# Patient Record
Sex: Male | Born: 1975 | Race: White | Hispanic: No | Smoking: Current every day smoker
Health system: Southern US, Community
[De-identification: ages and names within clinical notes are randomized; demographics above are authoritative.]

## PROBLEM LIST (undated history)

## (undated) DIAGNOSIS — B1920 Unspecified viral hepatitis C without hepatic coma: Secondary | ICD-10-CM

## (undated) DIAGNOSIS — C801 Malignant (primary) neoplasm, unspecified: Secondary | ICD-10-CM

## (undated) HISTORY — DX: Malignant (primary) neoplasm, unspecified: C80.1

## (undated) HISTORY — DX: Unspecified viral hepatitis C without hepatic coma: B19.20

## (undated) HISTORY — PX: COLON SURGERY: SHX602

---

## 2013-05-01 DIAGNOSIS — F411 Generalized anxiety disorder: Secondary | ICD-10-CM | POA: Insufficient documentation

## 2013-05-01 DIAGNOSIS — F329 Major depressive disorder, single episode, unspecified: Secondary | ICD-10-CM | POA: Insufficient documentation

## 2013-05-01 DIAGNOSIS — F410 Panic disorder [episodic paroxysmal anxiety] without agoraphobia: Secondary | ICD-10-CM | POA: Insufficient documentation

## 2014-03-19 DIAGNOSIS — I951 Orthostatic hypotension: Secondary | ICD-10-CM | POA: Insufficient documentation

## 2014-03-26 DIAGNOSIS — C189 Malignant neoplasm of colon, unspecified: Secondary | ICD-10-CM | POA: Insufficient documentation

## 2014-03-26 DIAGNOSIS — F17200 Nicotine dependence, unspecified, uncomplicated: Secondary | ICD-10-CM | POA: Insufficient documentation

## 2014-03-26 DIAGNOSIS — R918 Other nonspecific abnormal finding of lung field: Secondary | ICD-10-CM | POA: Insufficient documentation

## 2015-06-06 DIAGNOSIS — R748 Abnormal levels of other serum enzymes: Secondary | ICD-10-CM | POA: Insufficient documentation

## 2016-06-04 ENCOUNTER — Encounter: Payer: Self-pay | Admitting: Family Medicine

## 2016-06-04 ENCOUNTER — Ambulatory Visit (INDEPENDENT_AMBULATORY_CARE_PROVIDER_SITE_OTHER): Payer: BLUE CROSS/BLUE SHIELD | Admitting: Family Medicine

## 2016-06-04 VITALS — BP 128/82 | HR 85 | Ht 68.0 in | Wt 207.7 lb

## 2016-06-04 DIAGNOSIS — Z5189 Encounter for other specified aftercare: Secondary | ICD-10-CM

## 2016-06-04 DIAGNOSIS — B1710 Acute hepatitis C without hepatic coma: Secondary | ICD-10-CM | POA: Diagnosis not present

## 2016-06-04 DIAGNOSIS — C189 Malignant neoplasm of colon, unspecified: Secondary | ICD-10-CM | POA: Insufficient documentation

## 2016-06-04 DIAGNOSIS — F411 Generalized anxiety disorder: Secondary | ICD-10-CM

## 2016-06-04 DIAGNOSIS — F1011 Alcohol abuse, in remission: Secondary | ICD-10-CM

## 2016-06-04 DIAGNOSIS — F1010 Alcohol abuse, uncomplicated: Secondary | ICD-10-CM

## 2016-06-04 DIAGNOSIS — F1990 Other psychoactive substance use, unspecified, uncomplicated: Secondary | ICD-10-CM

## 2016-06-04 DIAGNOSIS — E669 Obesity, unspecified: Secondary | ICD-10-CM

## 2016-06-04 DIAGNOSIS — F1911 Other psychoactive substance abuse, in remission: Secondary | ICD-10-CM

## 2016-06-04 DIAGNOSIS — B1920 Unspecified viral hepatitis C without hepatic coma: Secondary | ICD-10-CM | POA: Insufficient documentation

## 2016-06-04 DIAGNOSIS — F32A Depression, unspecified: Secondary | ICD-10-CM

## 2016-06-04 DIAGNOSIS — K029 Dental caries, unspecified: Secondary | ICD-10-CM

## 2016-06-04 DIAGNOSIS — Z720 Tobacco use: Secondary | ICD-10-CM

## 2016-06-04 DIAGNOSIS — Z9221 Personal history of antineoplastic chemotherapy: Secondary | ICD-10-CM

## 2016-06-04 DIAGNOSIS — R748 Abnormal levels of other serum enzymes: Secondary | ICD-10-CM

## 2016-06-04 DIAGNOSIS — C184 Malignant neoplasm of transverse colon: Secondary | ICD-10-CM

## 2016-06-04 DIAGNOSIS — F17200 Nicotine dependence, unspecified, uncomplicated: Secondary | ICD-10-CM

## 2016-06-04 DIAGNOSIS — F329 Major depressive disorder, single episode, unspecified: Secondary | ICD-10-CM

## 2016-06-04 DIAGNOSIS — K047 Periapical abscess without sinus: Secondary | ICD-10-CM

## 2016-06-04 NOTE — Progress Notes (Signed)
Peter Boyd, D.O. Primary care at [facility]:    Chief Complaint  Patient presents with  . Establish Care  . Hepatitis C    Request referral to specialist   New pt, here to establish care.   HPI: Peter Boyd is a pleasant 40 y.o. male who presents to [place] [facility] today Patient known to me from occ seeing him at previous practice and presents today with his new girlfriend to become est.   He has a long history of drug abuse and alcohol abuse in his 20-30's.  In late 2014 he started having a lot of abdominal symptoms- (episodic N/V/D, blood in stool) and for several weeks to months and a couple OV with me during that time; pt repeatedly refused referral to gastroenterology despite being + Hep C and my repeated recommendations once + bld in stool and pt started wt loss.   It was the second GI Doc that I referred him to that he went and had CT scan and subsequent colonscope revealed mass.    At that time he was addicted to drugs which was the most important thing to him and he did not care to take care of himself.   Lost to f/up with me.   On 03/19/2014 patient presented to [facility] with several day history of abd pain, N/V, wt loss and dizziness.  Found to have obstruction and underwent colonic resection for AdenoCA and discharged 03-26-14.   "Discharge Summaries - in this encounter Peter Montane, MD - 03/26/2014 9:52 AM EDT Formatting of this note may be different from the original. [place] Discharge Summary  PCP: No primary provider on file. Discharge Details   Admit date: 03/19/2014 Discharge date and time: 03/26/2014 10:40 AM  Hospital LOS: 7 days  DISCHARGE DIAGNOSES: 1.Colonic obstruction, post removal of terminal ileum, partial colectomy with ileocolostomy. Mobilization of splenic flexure.  2. 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  3. Acute blood loss anemia, post 2 unit PRBCs.  4. Hepatitis C positive. Follow up with GI for further management.  5. Alcohol abuse/dependency, continuous. No evidence of withdrawal at this time.  6. Elevated transaminases, trending down. Partly from alcohol abuse/ischemia, no report of hypotension.  7. LLL Lung nodule (2 mm). Follow CT chest in 6 months. The patient is a smoker.  8. Dizziness, resolved.   SECONDARY DIAGNOSES: Past Medical History    Diagnosis Date  . Hepatitis C  . Colonic mass  . Alcohol abuse  . Adenopathy  . Elevated liver enzymes  . Acute blood loss anemia  History reviewed. No pertinent past surgical history.  CONSULTATIONS: Dr. Lucienne Capers III, GI.  Dr. Illene Regulus, General Surgery.   PROCEDURES: 03/21/2014, Removal of terminal ileum, partial colectomy with ileocolostomy. Mobilization of splenic flexure"  Dr. Grier Mitts, Oncology- seen initially on 04/08/16. "Surgical pathology revealed T3 N1 A MX, stage IIIB with poorly differentiated, grade 3 adenocarcinoma in the transverse colon. Angiolymphatic invasion was noted and 1/56 nodes positive for metastatic disease. A PET scan was obtained which revealed hypermetabolic right pericardiac and right internal thoracic lymph nodes with maximum SUV of 6.9. There are also 2 small low attenuation lesions in the liver that were not hypermetabolic that may have been due to small size. This obviously leaves concern for the possibility of metastatic disease".    Pt subsequently chose to get  definitive treatment at the [facility] in [place]. Underwent multiple chemo/rad txmnts down there.     It was towards the end of that treatment in [place], that his hepatitis C was re-addressed and patient was told to follow-up with a doctor back up here in [place].  He is requesting GI referral today.   Prefers to go to his GF's GI Doc at [facility]  He also needs referral for a Hem/Onc Doc  here who can do reg f/up with pt- told to get one by his [place] ONC Docs    Since his diagnosis he has not used any drugs and rarely ever drinks.  He continues to smoke though along with his GF.     No other complaints or acute sx      Past Medical History  Diagnosis Date  . Hepatitis C   . Cancer [facility])       Past Surgical History  Procedure Laterality Date  . Colon surgery        Family History  Problem Relation Age of Onset  . Cancer Mother     breast  . Diabetes Mother   . Hypertension Father   . Stroke Father   . Cancer Father     colon      History  Drug Use No  ,    History  Alcohol Use No  ,    History  Smoking status  . Current Every Day Smoker -- 0.50 packs/day  Smokeless tobacco  . Not on file  ,     History  Sexual Activity  . Sexual Activity: Yes  . Birth Web designer: None     I DID NOT ORDER THESE MEDS-->  These were documented by CMA as pt was taking them at time of OV Meds ordered this encounter  Medications  . Ascorbic Acid (VITAMIN C) 1000 MG tablet    Sig: Take 1,000 mg by mouth daily.  . Milk Thistle Extract 175 MG TABS    Sig: Take 175 mg by mouth daily.  . Multiple Vitamin (MULTIVITAMIN) tablet    Sig: Take 1 tablet by mouth daily.     Patient's Medications  New Prescriptions   No medications on file  Previous Medications   ASCORBIC ACID (VITAMIN C) 1000 MG TABLET    Take 1,000 mg by mouth daily.   MILK THISTLE EXTRACT 175 MG TABS    Take 175 mg by mouth daily.   MULTIPLE VITAMIN (MULTIVITAMIN) TABLET    Take 1 tablet by mouth daily.  Modified Medications   No medications on file  Discontinued Medications   No medications on file     Review of patient's allergies indicates no known allergies. Outpatient Encounter Prescriptions as of 06/04/2016  Medication Sig Note  . Ascorbic Acid (VITAMIN C) 1000 MG tablet Take 1,000 mg by mouth daily. 06/04/2016: Received from: [facility]: Take  1,000 mg by mouth.  . Milk Thistle Extract 175 MG TABS Take 175 mg by mouth daily. 06/04/2016: Received from: [facility]: Take 175 mg by mouth.  . Multiple Vitamin (MULTIVITAMIN) tablet Take 1 tablet by mouth daily.    No facility-administered encounter medications on file as of 06/04/2016.   Review of Systems:   ( Completed via Adult Medical History Intake form today ) General:   Denies fever, chills, appetite changes, unexplained weight loss.  Optho/Auditory:   Denies visual changes, blurred vision/LOV, ringing in ears/ diff hearing  Respiratory:   Denies SOB, DOE, cough, wheezing.  Cardiovascular:   Denies chest pain, palpitations, new onset peripheral edema  Gastrointestinal:   Denies nausea, vomiting, diarrhea.  Genitourinary:    Denies dysuria, increased frequency, flank pain.  Endocrine:     Denies hot or cold intolerance, polyuria, polydipsia. Musculoskeletal:  Denies unexplained myalgias, joint swelling, arthralgias, gait problems.  Skin:  Denies rash, suspicious lesions or new/ changes in moles Neurological:    Denies dizziness, syncope, unexplained weakness, lightheadedness, numbness  Psychiatric/Behavioral:   Denies mood changes, suicidal or homicidal ideations, hallucinations    Objective:   Blood pressure 128/82, pulse 85, height 5\' 8"  (1.727 m), weight 207 lb 11.2 oz (94.212 kg). Body mass index is 31.59 kg/(m^2).  General: Well Developed, well nourished, and in no acute distress.  Neuro: Alert and oriented x3, extra-ocular muscles intact, sensation grossly intact.  HEENT: Normocephalic, atraumatic, pupils equal round reactive to light, neck supple, no gross masses, no carotid bruits, no JVD apprec Skin: no gross suspicious lesions or rashes  Cardiac: Regular rate and rhythm, no murmurs rubs or gallops.  Respiratory: Essentially clear to auscultation bilaterally. Not using accessory muscles, speaking in full sentences.  Abdominal: Soft, not grossly  distended Musculoskeletal: Ambulates w/o diff, FROM * 4 ext.  Vasc: less 2 sec cap RF, warm and pink  Psych:  No HI/SI, judgement and insight good.    Impression and Recommendations:    The patient was counseled, risk factors were discussed, anticipatory guidance given.  Hepatitis C Obtain labs.  Refer to GI.   Pt with h/o IVD use and found to be + Nov-Dec '14.   h/o Colon cancer- resection and finished chemo/rad Dec 2015 Discharged from [facility] in [place] with every 6 month surveillance needed.   Needs refer to [facility] Doc  h/o freq Infected dental caries Pt states had dental abcess- txed end may, but now back and requesting ABX and has f/up OV with dentist but needs abx first. ALL r/w pt. R/b meds.  Oral rinses.  Current smoker Abstinence recommended. Patient declines.  h/o GAD Stable, doing well and very happy with current GF  h/o Clinical depression Stable, doing well and very happy with current GF  H/O drug abuse None since CA dx  Obese Aware of need to lose wt   Orders Placed This Encounter  Procedures  . CBC with Differential/Platelet  . COMPLETE METABOLIC PANEL WITH GFR  . Hemoglobin A1c  . Lipid panel  . TSH  . Vitamin B12  . VITAMIN D 25 Hydroxy (Vit-D Deficiency, Fractures)  . Hepatitis C antibody  . Ambulatory referral to Gastroenterology  . Ambulatory referral to Hematology    Gross side effects, risk and benefits, and alternatives of medications discussed with patient.  Patient is aware that all medications have potential side effects and we are unable to predict every side effect or drug-drug interaction that may occur.  Expresses verbal understanding and consents to current therapy plan and treatment regimen.  Please see AVS handed out to patient at the end of our visit for further patient instructions/ counseling done pertaining to today's office visit.      No orders of the defined types were placed in this encounter.      Note: This document was prepared using Dragon voice recognition software and may include unintentional dictation errors.

## 2016-06-04 NOTE — Patient Instructions (Addendum)
Referral to gastroenterology. Patient prefers to go to his significant other's gastroenterologist Dr. Nyoka Cowden at [facility].  We will see who a local oncologist is in the very near future and referral will be placed for patient for his colon cancer surveillance six-month screening in bowels.  One week or within the next week come in for fasting blood work. Orders placed today.     Hepatitis C Hepatitis C is a viral infection of the liver. It can lead to scarring of the liver (cirrhosis), liver failure, or liver cancer. Hepatitis C may go undetected for months or years because people with the infection may not have symptoms, or they may have only mild symptoms. CAUSES  Hepatitis C is caused by the hepatitis C virus (HCV). The virus can be passed from one person to another through:  Blood.  Contaminated needles, such as those used for tattooing, body piercing, acupuncture, or injecting drugs.  Having unprotected sex with an infected person.  Childbirth.  Blood transfusions or organ transplants done in the [place] before 1992. RISK FACTORS Risk factors for hepatitis C include:  Having unprotected sex with an infected person.  Using illegal drugs. SIGNS AND SYMPTOMS  Symptoms of hepatitis C may include:  Fatigue.  Loss of appetite.  Nausea.  Vomiting.  Abdominal pain.  Dark yellow urine.  Yellowish skin and eyes (jaundice).  Itching of the skin.  Clay-colored bowel movements.  Joint pain. Symptoms are not always present.  DIAGNOSIS  Hepatitis C is diagnosed with blood tests. Other types of tests may also be done to check how your liver is functioning. TREATMENT  Your health care provider may perform noninvasive tests or a liver biopsy to help determine the best course of treatment. Treatment for hepatitis C may include one or more medicines. Your health care provider may check you for a recurring infection or other liver conditions every 6-12 months after  treatment. HOME CARE INSTRUCTIONS   Rest as needed.  Take all medicines as directed by your health care provider.  Do not take any medicine unless approved by your health care provider. This includes over-the-counter medicine and birth control pills.  Do not drink alcohol.  Do not have sex until approved by your health care provider.  Do not share toothbrushes, nail clippers, razors, or needles. PREVENTION There is no vaccine for hepatitis C. The only way to prevent the disease is to reduce the risk of exposure to the virus. This may be done by:  Practicing safe sex and using condoms.  Avoiding illegal drugs. SEEK MEDICAL CARE IF:  You have a fever.  You develop abdominal pain.  You develop dark urine.  You have clay-colored bowel movements.  You develop joint pains. SEEK IMMEDIATE MEDICAL CARE IF:  You have increasing fatigue or weakness.  You lose your appetite.  You feel nauseous or vomit.  You develop jaundice or your jaundice gets worse.  You bruise or bleed easily. MAKE SURE YOU:   Understand these instructions.  Will watch your condition.  Will get help right away if you are not doing well or get worse.   This information is not intended to replace advice given to you by your health care provider. Make sure you discuss any questions you have with your health care provider.   Document Released: 11/08/2000 Document Revised: 12/02/2014 Document Reviewed: 02/23/2014 Elsevier Interactive Patient Education [employer].

## 2016-06-05 DIAGNOSIS — F1911 Other psychoactive substance abuse, in remission: Secondary | ICD-10-CM | POA: Insufficient documentation

## 2016-06-05 DIAGNOSIS — F1011 Alcohol abuse, in remission: Secondary | ICD-10-CM | POA: Insufficient documentation

## 2016-06-06 ENCOUNTER — Telehealth: Payer: Self-pay

## 2016-06-06 NOTE — Telephone Encounter (Signed)
Pt's SO called stating that at his OV on 06/04/2016, you advised him that you were going to call in Amoxicillin for abscessed tooth and a medication for his allergies.  They have checked with the pharmacy several times and there are no prescriptions there for him.  Please advise.  Charyl Bigger, CMA

## 2016-06-07 MED ORDER — AMOXICILLIN-POT CLAVULANATE 875-125 MG PO TABS: 1.0000 | ORAL_TABLET | Freq: Two times a day (BID) | ORAL | Status: DC

## 2016-06-07 NOTE — Telephone Encounter (Signed)
LVM informing patient of this information.  Charyl Bigger, CMA

## 2016-06-10 ENCOUNTER — Encounter: Payer: Self-pay | Admitting: Family Medicine

## 2016-06-10 DIAGNOSIS — K029 Dental caries, unspecified: Secondary | ICD-10-CM | POA: Insufficient documentation

## 2016-06-10 DIAGNOSIS — K047 Periapical abscess without sinus: Secondary | ICD-10-CM

## 2016-06-10 DIAGNOSIS — Z9221 Personal history of antineoplastic chemotherapy: Secondary | ICD-10-CM | POA: Insufficient documentation

## 2016-06-10 DIAGNOSIS — E669 Obesity, unspecified: Secondary | ICD-10-CM | POA: Insufficient documentation

## 2016-06-10 NOTE — Assessment & Plan Note (Signed)
Obtain labs.  Refer to GI.   Pt with h/o IVD use and found to be + Nov-Dec '14.

## 2016-06-11 NOTE — Assessment & Plan Note (Signed)
Stable, doing well and very happy with current GF

## 2016-06-11 NOTE — Assessment & Plan Note (Signed)
Abstinence recommended. Patient declines.

## 2016-06-11 NOTE — Assessment & Plan Note (Signed)
Aware of need to lose wt

## 2016-06-11 NOTE — Assessment & Plan Note (Signed)
None since CA dx

## 2016-06-11 NOTE — Assessment & Plan Note (Signed)
Pt states had dental abcess- txed end may, but now back and requesting ABX and has f/up OV with dentist but needs abx first. ALL r/w pt. R/b meds.  Oral rinses.

## 2016-06-11 NOTE — Assessment & Plan Note (Signed)
Discharged from [facility] in [place] with every 6 month surveillance needed.   Needs refer to [facility] Doc

## 2016-06-17 ENCOUNTER — Other Ambulatory Visit (INDEPENDENT_AMBULATORY_CARE_PROVIDER_SITE_OTHER): Payer: BLUE CROSS/BLUE SHIELD

## 2016-06-17 DIAGNOSIS — F17200 Nicotine dependence, unspecified, uncomplicated: Secondary | ICD-10-CM

## 2016-06-17 DIAGNOSIS — B1710 Acute hepatitis C without hepatic coma: Secondary | ICD-10-CM | POA: Diagnosis not present

## 2016-06-17 DIAGNOSIS — F1911 Other psychoactive substance abuse, in remission: Secondary | ICD-10-CM

## 2016-06-17 DIAGNOSIS — F1011 Alcohol abuse, in remission: Secondary | ICD-10-CM

## 2016-06-17 DIAGNOSIS — R748 Abnormal levels of other serum enzymes: Secondary | ICD-10-CM

## 2016-06-18 LAB — COMPLETE METABOLIC PANEL WITH GFR
ALBUMIN: 4.2 g/dL (ref 3.6–5.1)
ALT: 91 U/L — AB (ref 9–46)
AST: 45 U/L — AB (ref 10–40)
Alkaline Phosphatase: 65 U/L (ref 40–115)
BUN: 13 mg/dL (ref 7–25)
CALCIUM: 9.4 mg/dL (ref 8.6–10.3)
CHLORIDE: 102 mmol/L (ref 98–110)
CO2: 21 mmol/L (ref 20–31)
CREATININE: 0.87 mg/dL (ref 0.60–1.35)
GFR, Est African American: >89 mL/min (ref >=60)
GFR, Est Non African American: >89 mL/min (ref >=60)
GLUCOSE: 91 mg/dL (ref 65–99)
Potassium: 4.8 mmol/L (ref 3.5–5.3)
SODIUM: 137 mmol/L (ref 135–146)
Total Bilirubin: 0.6 mg/dL (ref 0.2–1.2)
Total Protein: 7.4 g/dL (ref 6.1–8.1)

## 2016-06-18 LAB — VITAMIN D 25 HYDROXY (VIT D DEFICIENCY, FRACTURES): VIT D 25 HYDROXY: 48 ng/mL (ref 30–100)

## 2016-06-18 LAB — HEMOGLOBIN A1C
HEMOGLOBIN A1C: 5.8 % — AB (ref <5.7)
Mean Plasma Glucose: 120 mg/dL

## 2016-06-18 LAB — LIPID PANEL
Cholesterol: 174 mg/dL (ref 125–200)
HDL: 45 mg/dL (ref >=40)
LDL CALC: 108 mg/dL (ref <130)
Total CHOL/HDL Ratio: 3.9 Ratio (ref <=5.0)
Triglycerides: 106 mg/dL (ref <150)
VLDL: 21 mg/dL (ref <30)

## 2016-06-24 ENCOUNTER — Encounter: Payer: Self-pay | Admitting: Family Medicine

## 2016-06-24 ENCOUNTER — Ambulatory Visit (INDEPENDENT_AMBULATORY_CARE_PROVIDER_SITE_OTHER): Payer: BLUE CROSS/BLUE SHIELD | Admitting: Family Medicine

## 2016-06-24 VITALS — BP 121/83 | HR 93 | Wt 210.8 lb

## 2016-06-24 DIAGNOSIS — F1010 Alcohol abuse, uncomplicated: Secondary | ICD-10-CM | POA: Diagnosis not present

## 2016-06-24 DIAGNOSIS — B1920 Unspecified viral hepatitis C without hepatic coma: Secondary | ICD-10-CM

## 2016-06-24 DIAGNOSIS — R7302 Impaired glucose tolerance (oral): Secondary | ICD-10-CM

## 2016-06-24 DIAGNOSIS — F411 Generalized anxiety disorder: Secondary | ICD-10-CM

## 2016-06-24 DIAGNOSIS — E669 Obesity, unspecified: Secondary | ICD-10-CM

## 2016-06-24 DIAGNOSIS — Z720 Tobacco use: Secondary | ICD-10-CM

## 2016-06-24 DIAGNOSIS — R748 Abnormal levels of other serum enzymes: Secondary | ICD-10-CM

## 2016-06-24 DIAGNOSIS — F1011 Alcohol abuse, in remission: Secondary | ICD-10-CM

## 2016-06-24 DIAGNOSIS — F17200 Nicotine dependence, unspecified, uncomplicated: Secondary | ICD-10-CM

## 2016-06-24 DIAGNOSIS — K029 Dental caries, unspecified: Secondary | ICD-10-CM

## 2016-06-24 NOTE — Progress Notes (Signed)
Assessment and plan:  1. Glucose intolerance (impaired glucose tolerance)   2. Hepatitis C virus infection, unspecified chronicity   3. Obese   4. H/O: alcohol abuse   5. h/o GAD   6. Current smoker   7. Abnormal liver enzymes   8. Dental caries      Counseling performed regarding lifestyle modification and dietary modifications.  Patient will follow up with [facility] gastroenterologist in near future  Encouraged weight loss.  Continue abstinence from alcohol and all drugs  Mood:  With hx of depression and GAD is stable; declines need for medications  Liver enzymes are the best they've ever been since I've known patient for 3 years now.  Explained to patient that I do not recommend more antibiotics at this time. He needs to see a dentist. May do salt water and an occasional oral peroxide rinse.  Do not take an antibiotic off label or not as prescribed by a physician.  Discussed with patient development of anabolic resistance.    Patient's Medications  New Prescriptions   No medications on file  Previous Medications   ASCORBIC ACID (VITAMIN C) 1000 MG TABLET    Take 1,000 mg by mouth daily.   CYANOCOBALAMIN 1000 MCG TABLET    Take 1,000 mcg by mouth daily.   MILK THISTLE EXTRACT 175 MG TABS    Take 175 mg by mouth daily.   MULTIPLE VITAMIN (MULTIVITAMIN) TABLET    Take 1 tablet by mouth daily.  Modified Medications   No medications on file  Discontinued Medications   AMOXICILLIN-CLAVULANATE (AUGMENTIN) 875-125 MG TABLET    Take 1 tablet by mouth 2 (two) times daily.    Return in about 4 months (around 10/24/2016) for Follow-up of current medical issues.  Anticipatory guidance and routine counseling done re: condition, txmnt options and need for follow up. All questions of patient's were answered.   Gross side effects, risk and benefits, and alternatives of medications discussed with patient.   Patient is aware that all medications have potential side effects and we are unable to predict every sideeffect or drug-drug interaction that may occur.  Expresses verbal understanding and consents to current therapy plan and treatment regiment.  Please see AVS handed out to patient at the end of our visit for additional patient instructions/ counseling done pertaining to today's office visit.  Note: This document was prepared using Dragon voice recognition software and may include unintentional dictation errors.   ----------------------------------------------------------------------------------------------------------------------  Subjective:   CC: Peter Boyd is a 40 y.o. male who presents to [place] at [facility] today for Review of recent labs   Patient will be going to see Hem-Onc in near future as well as the gastroenterologist at [facility] that his girlfriend goes to.  His liver enzymes are the best they have been in a very long time. In the past was well over 1000- now both less than 2 times N.   Feels good. Pt looking forward to getting his Hep C txed.   No complaints today except for he is worried his tooth abscess will come back. He was on antibiotic treatment end of May and then again when I saw him a couple weeks ago I gave him another round. He is requesting more ABX today.  He cannot afford a dentist right now as they want cash up from of over $200 and he just does not have the money.  Patient and girlfriend continue to smoke.  They have not given any thoughts into quitting since our long discussion last office visit.   Wt Readings from Last 3 Encounters:  06/24/16 210 lb 12.8 oz (95.6 kg)  06/04/16 207 lb 11.2 oz (94.2 kg)   Temp Readings from Last 3 Encounters:  No data found for Temp   BP Readings from Last 3 Encounters:  06/24/16 121/83  06/04/16 128/82   Pulse Readings from Last 3 Encounters:  06/24/16 93  06/04/16 85    Full medical history  updated and reviewed in the office today  Patient Active Problem List   Diagnosis Date Noted  . Glucose intolerance (impaired glucose tolerance) 06/24/2016  . S/P chemotherapy, time since greater than 12 weeks 06/10/2016  . Obese 06/10/2016  . Hx antineoplastic chemotherapy 06/10/2016  . h/o freq Infected dental caries 06/10/2016  . H/O: alcohol abuse 06/05/2016  . H/O drug abuse 06/05/2016  . Hepatitis C 06/04/2016  . h/o Colon cancer- resection and finished chemo/rad Dec 2015 06/04/2016  . Abnormal liver enzymes 06/06/2015  . 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 Current smoker 03/26/2014  . Lung mass 03/26/2014  . h/o GAD 05/01/2013  . Episodic paroxysmal anxiety disorder 05/01/2013  . h/o Clinical depression 05/01/2013    Past Medical History:  Diagnosis Date  . Cancer ([place])   . Hepatitis C     Past Surgical History:  Procedure Laterality Date  . COLON SURGERY      Social History  Substance Use Topics  . Smoking status: Current Every Day Smoker    Packs/day: 0.50  . Smokeless tobacco: Never Used  . Alcohol use No     Comment: h/o ETOH abuse    family history includes Cancer in his father and mother; Diabetes in his mother; Hypertension in his father; Stroke in his father.   Medications: Current Outpatient Prescriptions  Medication Sig Dispense Refill  . Ascorbic Acid (VITAMIN C) 1000 MG tablet Take 1,000 mg by mouth daily.    . cyanocobalamin 1000 MCG tablet Take 1,000 mcg by mouth daily.    . Milk Thistle Extract 175 MG TABS Take 175 mg by mouth daily.    . Multiple Vitamin (MULTIVITAMIN) tablet Take 1 tablet by mouth daily.     No current facility-administered medications for this visit.     Allergies:  No Known Allergies   ROS:  Const:    no fevers, chills Eyes:    conjunctiva clear, no vision changes or blurred vision ENT:  no hearing difficulties, no dysphagia, no dysphonia, no nose bleeds CV:   no chest pain, arrhythmias,  no orthopnea, no PND Pulm:   no SOB at rest or exertion, no Wheeze, no DIB, no hemoptysis GI:    no N/V/D/C, no abd pain GU:   no blood in urine or inc freq or urgency Heme/Onc:    no unexplained bleeding, no night sweats, no more fatigue than usual Neuro:   No dizziness, no LOC, No unexplained weakness or numbness Endo:   no unexplained wt loss or gain M-Sk:   no localized myalgias or arthralgias Psych:    No SI/HI, no memory prob or unexplained confusion    Objective:  Blood pressure 121/83, pulse 93, weight 210 lb 12.8 oz (95.6 kg). Body mass index is 32.05 kg/m.  Gen: Well NAD, A and O *3 HEENT: [place]/AT, EOMI,  MMM, OP- clr Lungs: Normal work of breathing. CTA B/L, no Wh, rhonchi Heart: RRR, S1, S2 WNL's, no MRG Abd: Soft.  No gross distention Exts: warm, pink,  Brisk capillary refill, warm and well perfused.    Recent Results (from the past 2160 hour(s))  COMPLETE METABOLIC PANEL WITH GFR     Status: Abnormal   Collection Time: 06/17/16  8:42 AM  Result Value Ref Range   Sodium 137 135 - 146 mmol/L   Potassium 4.8 3.5 - 5.3 mmol/L   Chloride 102 98 - 110 mmol/L   CO2 21 20 - 31 mmol/L   Glucose, Bld 91 65 - 99 mg/dL   BUN 13 7 - 25 mg/dL   Creat 0.87 0.60 - 1.35 mg/dL   Total Bilirubin 0.6 0.2 - 1.2 mg/dL   Alkaline Phosphatase 65 40 - 115 U/L   AST 45 (H) 10 - 40 U/L   ALT 91 (H) 9 - 46 U/L   Total Protein 7.4 6.1 - 8.1 g/dL   Albumin 4.2 3.6 - 5.1 g/dL   Calcium 9.4 8.6 - 10.3 mg/dL   GFR, Est African American >89 >=60 mL/min   GFR, Est Non African American >89 >=60 mL/min  Hemoglobin A1c     Status: Abnormal   Collection Time: 06/17/16  8:42 AM  Result Value Ref Range   Hgb A1c MFr Bld 5.8 (H) <5.7 %    Comment:   For someone without known diabetes, a hemoglobin A1c value between 5.7% and 6.4% is consistent with prediabetes and should be confirmed with a follow-up test.   For someone with known diabetes, a value <7% indicates that their diabetes is well  controlled. A1c targets should be individualized based on duration of diabetes, age, co-morbid conditions and other considerations.   This assay result is consistent with an increased risk of diabetes.   Currently, no consensus exists regarding use of hemoglobin A1c for diagnosis of diabetes in children.      Mean Plasma Glucose 120 mg/dL  Lipid panel     Status: None   Collection Time: 06/17/16  8:42 AM  Result Value Ref Range   Cholesterol 174 125 - 200 mg/dL   Triglycerides 106 <150 mg/dL   HDL 45 >=40 mg/dL   Total CHOL/HDL Ratio 3.9 <=5.0 Ratio   VLDL 21 <30 mg/dL   LDL Cholesterol 108 <130 mg/dL    Comment:   Total Cholesterol/HDL Ratio:CHD Risk                        Coronary Heart Disease Risk Table                                        Men       Women          1/2 Average Risk              3.4        3.3              Average Risk              5.0        4.4           2X Average Risk              9.6        7.1           3X Average Risk  23.4       11.0 Use the calculated Patient Ratio above and the CHD Risk table  to determine the patient's CHD Risk.   VITAMIN D 25 Hydroxy (Vit-D Deficiency, Fractures)     Status: None   Collection Time: 06/17/16  8:42 AM  Result Value Ref Range   Vit D, 25-Hydroxy 48 30 - 100 ng/mL    Comment: Vitamin D Status           25-OH Vitamin D        Deficiency                <20 ng/mL        Insufficiency         20 - 29 ng/mL        Optimal             > or = 30 ng/mL   For 25-OH Vitamin D testing on patients on D2-supplementation and patients for whom quantitation of D2 and D3 fractions is required, the QuestAssureD 25-OH VIT D, (D2,D3), LC/MS/MS is recommended: order code [id number] (patients > 2 yrs).

## 2016-06-24 NOTE — Patient Instructions (Addendum)
See if you're GI doctor will obtain a B12, TSH, CBC in addition to the labs that he or she will be obtaining on you.        Risk factors for prediabetes and type 2 diabetes Researchers don't fully understand why some people develop prediabetes and type 2 diabetes and others don't.  It's clear that certain factors increase the risk, however, including:  Weight. The more fatty tissue you have, the more resistant your cells become to insulin.  Inactivity. The less active you are, the greater your risk. Physical activity helps you control your weight, uses up glucose as energy and makes your cells more sensitive to insulin.  Family history. Your risk increases if a parent or sibling has type 2 diabetes.  Race. Although it's unclear why, people of certain races - including blacks, Hispanics, American Indians and Asian-Americans - are at higher risk.  Age. Your risk increases as you get older. This may be because you tend to exercise less, lose muscle mass and gain weight as you age. But type 2 diabetes is also increasing dramatically among children, adolescents and younger adults.  Gestational diabetes. If you developed gestational diabetes when you were pregnant, your risk of developing prediabetes and type 2 diabetes later increases. If you gave birth to a baby weighing more than 9 pounds (4 kilograms), you're also at risk of type 2 diabetes.  Polycystic ovary syndrome. For women, having polycystic ovary syndrome - a common condition characterized by irregular menstrual periods, excess hair growth and obesity - increases the risk of diabetes.  High blood pressure. Having blood pressure over 140/90 millimeters of mercury (mm Hg) is linked to an increased risk of type 2 diabetes.  Abnormal cholesterol and triglyceride levels. If you have low levels of high-density lipoprotein (HDL), or "good," cholesterol, your risk of type 2 diabetes is higher. Triglycerides are another type of fat carried in the  blood. People with high levels of triglycerides have an increased risk of type 2 diabetes. Your doctor can let you know what your cholesterol and triglyceride levels are.    A good guide to good carbs: The glycemic index ---If you have diabetes, or at risk for diabetes, you know all too well that when you eat carbohydrates, your blood sugar goes up. The total amount of carbs you consume at a meal or in a snack mostly determines what your blood sugar will do. But the food itself also plays a role. A serving of white rice has almost the same effect as eating pure table sugar - a quick, high spike in blood sugar. A serving of lentils has a slower, smaller effect.  ---Picking good sources of carbs can help you control your blood sugar and your weight. Even if you don't have diabetes, eating healthier carbohydrate-rich foods can help ward off a host of chronic conditions, from heart disease to various cancers to, well, diabetes.  ---One way to choose foods is with the glycemic index (GI). This tool measures how much a food boosts blood sugar.  The glycemic index rates the effect of a specific amount of a food on blood sugar compared with the same amount of pure glucose. A food with a glycemic index of 28 boosts blood sugar only 28% as much as pure glucose. One with a GI of 95 acts like pure glucose.  High glycemic foods result in a quick spike in insulin and blood sugar (also known as blood glucose). Low glycemic foods have a slower, smaller effect.  Using the glycemic index Using the glycemic index is easy: choose foods in the low GI category instead of those in the high GI category (see below), and go easy on those in between. Low glycemic index (GI of 55 or less): Most fruits and vegetables, beans, minimally processed grains, pasta, low-fat dairy foods, and nuts.  Moderate glycemic index (GI 56 to 69): White and sweet potatoes, corn, white rice, couscous, breakfast cereals such as Cream of Wheat and  Mini Wheats.  High glycemic index (GI of 70 or higher): White bread, rice cakes, most crackers, bagels, cakes, doughnuts, croissants, most packaged breakfast cereals. You can see the values for 100 commons foods and get links to more at www.health.CheapToothpicks.si.  Swaps for lowering glycemic index  Instead of this high-glycemic index food Eat this lower-glycemic index food  White rice Brown rice or converted rice  Instant oatmeal Steel-cut oats  Cornflakes Bran flakes  Baked potato Pasta, bulgur  White bread Whole-grain bread  Corn Peas or leafy greens         Guidelines for a Low Cholesterol, Low Saturated Fat Diet   Fats - Limit total intake of fats and oils. - Avoid butter, stick margarine, shortening, lard, palm and coconut oils. - Limit mayonnaise, salad dressings, gravies and sauces, unless they are homemade with low-fat ingredients. - Limit chocolate. - Choose low-fat and nonfat products, such as low-fat mayonnaise, low-fat or non-hydrogenated peanut butter, low-fat or fat-free salad dressings and nonfat gravy. - Use vegetable oil, such as canola or olive oil. - Look for margarine that does not contain trans fatty acids. - Use nuts in moderate amounts. - Read ingredient labels carefully to determine both amount and type of fat present in foods. Limit saturated and trans fats! - Avoid high-fat processed and convenience foods.  Meats and Meat Alternatives - Choose fish, chicken, [place] and lean meats. - Use dried beans, peas, lentils and tofu. - Limit egg yolks to three to four per week. - If you eat red meat, limit to no more than three servings per week and choose loin or round cuts. - Avoid fatty meats, such as bacon, sausage, franks, luncheon meats and ribs. - Avoid all organ meats, including liver.  Dairy - Choose nonfat or low-fat milk, yogurt and cottage cheese. - Most cheeses are high in fat. Choose cheeses made from non-fat milk, such as mozzarella and  ricotta cheese. - Choose light or fat-free cream cheese and sour cream. - Avoid cream and sauces made with cream.  Fruits and Vegetables - Eat a wide variety of fruits and vegetables. - Use lemon juice, vinegar or "mist" olive oil on vegetables. - Avoid adding sauces, fat or oil to vegetables.  Breads, Cereals and Grains - Choose whole-grain breads, cereals, pastas and rice. - Avoid high-fat snack foods, such as granola, cookies, pies, pastries, doughnuts and croissants.  Cooking Tips - Avoid deep fried foods. - Trim visible fat off meats and remove skin from poultry before cooking. - Bake, broil, boil, poach or roast poultry, fish and lean meats. - Drain and discard fat that drains out of meat as you cook it. - Add little or no fat to foods. - Use vegetable oil sprays to grease pans for cooking or baking. - Steam vegetables. - Use herbs or no-oil marinades to flavor foods.

## 2016-06-26 ENCOUNTER — Ambulatory Visit: Payer: BLUE CROSS/BLUE SHIELD

## 2016-06-26 ENCOUNTER — Other Ambulatory Visit (HOSPITAL_BASED_OUTPATIENT_CLINIC_OR_DEPARTMENT_OTHER): Payer: BLUE CROSS/BLUE SHIELD

## 2016-06-26 ENCOUNTER — Encounter: Payer: Self-pay | Admitting: Hematology & Oncology

## 2016-06-26 ENCOUNTER — Ambulatory Visit (HOSPITAL_BASED_OUTPATIENT_CLINIC_OR_DEPARTMENT_OTHER): Payer: BLUE CROSS/BLUE SHIELD | Admitting: Hematology & Oncology

## 2016-06-26 VITALS — BP 127/92 | HR 80 | Temp 98.2°F | Resp 20 | Ht 68.0 in | Wt 211.0 lb

## 2016-06-26 DIAGNOSIS — B1920 Unspecified viral hepatitis C without hepatic coma: Secondary | ICD-10-CM

## 2016-06-26 DIAGNOSIS — Z803 Family history of malignant neoplasm of breast: Secondary | ICD-10-CM | POA: Diagnosis not present

## 2016-06-26 DIAGNOSIS — Z800 Family history of malignant neoplasm of digestive organs: Secondary | ICD-10-CM

## 2016-06-26 DIAGNOSIS — Z85038 Personal history of other malignant neoplasm of large intestine: Secondary | ICD-10-CM

## 2016-06-26 DIAGNOSIS — C184 Malignant neoplasm of transverse colon: Secondary | ICD-10-CM

## 2016-06-26 DIAGNOSIS — C779 Secondary and unspecified malignant neoplasm of lymph node, unspecified: Secondary | ICD-10-CM | POA: Diagnosis not present

## 2016-06-26 DIAGNOSIS — Z720 Tobacco use: Secondary | ICD-10-CM

## 2016-06-26 DIAGNOSIS — R64 Cachexia: Secondary | ICD-10-CM

## 2016-06-26 LAB — CBC WITH DIFFERENTIAL (CANCER CENTER ONLY)
BASO#: 0 10*3/uL (ref 0.0–0.2)
BASO%: 0.3 % (ref 0.0–2.0)
EOS%: 3 % (ref 0.0–7.0)
Eosinophils Absolute: 0.3 10*3/uL (ref 0.0–0.5)
HCT: 43.5 % (ref 38.7–49.9)
HEMOGLOBIN: 15.1 g/dL (ref 13.0–17.1)
LYMPH#: 3.1 10*3/uL (ref 0.9–3.3)
LYMPH%: 32.6 % (ref 14.0–48.0)
MCH: 31.1 pg (ref 28.0–33.4)
MCHC: 34.7 g/dL (ref 32.0–35.9)
MCV: 90 fL (ref 82–98)
MONO#: 0.6 10*3/uL (ref 0.1–0.9)
MONO%: 6.1 % (ref 0.0–13.0)
NEUT%: 58 % (ref 40.0–80.0)
NEUTROS ABS: 5.5 10*3/uL (ref 1.5–6.5)
Platelets: 231 10*3/uL (ref 145–400)
RBC: 4.85 10*6/uL (ref 4.20–5.70)
RDW: 13.2 % (ref 11.1–15.7)
WBC: 9.4 10*3/uL (ref 4.0–10.0)

## 2016-06-26 LAB — COMPREHENSIVE METABOLIC PANEL (CC13)
ALBUMIN: 4.6 g/dL (ref 3.5–5.5)
ALK PHOS: 70 IU/L (ref 39–117)
ALT: 103 IU/L — ABNORMAL HIGH (ref 0–44)
AST (SGOT): 55 IU/L — ABNORMAL HIGH (ref 0–40)
Albumin/Globulin Ratio: 1.6 (ref 1.2–2.2)
BUN / CREAT RATIO: 19 (ref 9–20)
BUN: 15 mg/dL (ref 6–24)
Bilirubin Total: 0.2 mg/dL (ref 0.0–1.2)
CHLORIDE: 100 mmol/L (ref 96–106)
CO2: 24 mmol/L (ref 18–29)
CREATININE: 0.79 mg/dL (ref 0.76–1.27)
Calcium, Ser: 9.5 mg/dL (ref 8.7–10.2)
GFR calc Af Amer: 129 mL/min/{1.73_m2} (ref >59)
GFR calc non Af Amer: 112 mL/min/{1.73_m2} (ref >59)
GLUCOSE: 93 mg/dL (ref 65–99)
Globulin, Total: 2.9 g/dL (ref 1.5–4.5)
POTASSIUM: 4.2 mmol/L (ref 3.5–5.2)
SODIUM: 134 mmol/L (ref 134–144)
Total Protein: 7.5 g/dL (ref 6.0–8.5)

## 2016-06-26 NOTE — Progress Notes (Signed)
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 Follow-up  : I need follow-up for my colon cancer.  HPI: Peter Boyd is a really nice 40 year old white male. Of note, there is a strong history of colon cancer on both sides of the family. Both his mother and father had colon cancer when their young.  He presented with anemia a couple years ago. There was some abdominal discomfort. He has some nausea and vomiting. This was back in February 2015.  A CT scan showed wall thickening of the transverse colon. There is also a lesion in the liver.  Because of family circumstantially, he could not have this taken care of at that time. He unfortunately then presented in April with worsening abdominal pain. He had a partial obstruction. He then underwent a partial colectomy and ileal colostomy on April 27. The pathology report, from [facility], showed a poorly differentiated adenocarcinoma. He had 1 out of 56 lymph nodes positive. There was some lymphovascular invasion. He was MSI stable.  He was thus staged at IIIB ([id number]).  He had a PET scan done in May which showed a hypermetabolic right pericardiac and right internal thoracic lymph node.  He then underwent biopsies which was negative.  He was then seen at the [facility] in [place]. They did a very thorough workup. He had an MRI of the liver which showed him to have cysts.  He was treated with FOLFOX. He had 12 cycles. He tolerated this fairly well. Of note, he does have hepatitis C. He got this after he was divorced and was hanging out with the wrong people. He sees a gastroenterologist next week.  He and his wife come in area and they live in [place]. He works for a [employer].  He just wanted to have local follow-up. He really had a good situation with the [facility] in which they would pay for him to come down.  At the  air[place] and [place] and transport him to the [facility] all free of charge.  He feels good. He has no complaints of nausea or vomiting. He has no abdominal pain. He has no change in bowel or bladder habits. He has no cough. He has no leg swelling.  He did have a Port-A-Cath in but this has been removed.  His last CAT scan was back in April 2016. This was done down in [place].  Overall, his performance status is ECOG 0.   Past Medical History:  Diagnosis Date  . Cancer ([place])   . Hepatitis C   :  Past Surgical History:  Procedure Laterality Date  . COLON SURGERY    :   Current Outpatient Prescriptions:  .  Ascorbic Acid (VITAMIN C) 1000 MG tablet, Take 1,000 mg by mouth daily., Disp: , Rfl:  .  cyanocobalamin 1000 MCG tablet, Take 1,000 mcg by mouth daily., Disp: , Rfl:  .  Milk Thistle Extract 175 MG TABS, Take 175 mg by mouth daily., Disp: , Rfl:  .  Multiple Vitamin (MULTIVITAMIN) tablet, Take 1 tablet by mouth daily., Disp: , Rfl: :  :  No Known Allergies:  Family History  Problem Relation Age of Onset  . Cancer Mother     breast  . Diabetes Mother   . Hypertension Father   . Stroke Father   . Cancer Father     colon  :  Social History  Social History  . Marital status: Significant Other    Spouse name: N/A  . Number of children: N/A  . Years of education: N/A   Occupational History  . Not on file.   Social History Main Topics  . Smoking status: Current Every Day Smoker    Packs/day: 0.50  . Smokeless tobacco: Never Used  . Alcohol use No     Comment: h/o ETOH abuse  . 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 Sexual activity: Yes    Birth control/ protection: None   Other Topics Concern  . Not on file   Social History Narrative  . No narrative on file  :  Pertinent items are noted in HPI.  Exam: _0 @  Well-developed and well-nourished white male in no obvious distress. Vital signs  show a temperature of 98.2. Pulse 80. Blood pressure 127/92. His weight is 211 pounds. Head and neck exam shows no ocular or oral lesions. There are no palpable cervical or supraclavicular lymph nodes. Lungs are clear. Cardiac exam regular rate and rhythm with a normal S1 and S2. There are no murmurs, rubs or bruits. Abdomen is soft. He has good bowel sounds. There is no fluid wave. There is no palpable liver or spleen tip. Has a well-healed laparotomy scar. Back exam shows no tenderness over the spine, ribs or hips. Extremities shows no clubbing, cyanosis or edema. He has good range of motion of his joints. Skin exam shows no rashes, ecchymosis or petechia. Neurological exam shows no focal neurological deficits.    Recent Labs  06/26/16 1510  WBC 9.4  HGB 15.1  HCT 43.5  PLT 231   No results for input(s): NA, K, CL, CO2, GLUCOSE, BUN, CREATININE, CALCIUM in the last 72 hours.  Blood smear review:  None   Pathology: See above     Assessment and Plan:  [name] is a very nice 40 year old white male. He had stage IIIB colon cancer. He was microsatellite stable.  This clearly, in my mind is a genetic issue. Both parents had colon cancer.  I think the biggest prognostic factor is that he had 56 lymph nodes sampled and only one was positive. I this really improves his outcome nicely.  We need to set him up with a CT scan. I'll get set up in the next couple weeks. I would favor every 6 month CT scans for right now.  I think the biggest issue now is his hepatitis C. He sees the gastroenterologist in a week or so. I'm sure that he will be put on one of the new hepatitis C agents. I do not see any contraindication for this to happen.  I'm just amazed as having good he looks. He really did a great job. His doctors down at [facility] in [place] did a really good job with him.  I will like to see him back in a couple months. If all looks good, then we'll probably get him on a 6 month schedule.  I  spent about an hour with he and his wife. His wife has been really bad Crohn's disease and has a ileostomy bag.

## 2016-06-27 LAB — PREALBUMIN: PREALBUMIN: 29 mg/dL (ref 12–34)

## 2016-06-27 LAB — CEA (IN HOUSE-CHCC): CEA (CHCC-In House): 1.79 ng/mL (ref 0.00–5.00)

## 2016-06-27 LAB — LACTATE DEHYDROGENASE: LDH: 187 U/L (ref 125–245)

## 2016-06-28 ENCOUNTER — Other Ambulatory Visit (HOSPITAL_BASED_OUTPATIENT_CLINIC_OR_DEPARTMENT_OTHER): Payer: BLUE CROSS/BLUE SHIELD

## 2016-06-28 ENCOUNTER — Ambulatory Visit (HOSPITAL_BASED_OUTPATIENT_CLINIC_OR_DEPARTMENT_OTHER): Payer: BLUE CROSS/BLUE SHIELD

## 2016-07-01 ENCOUNTER — Telehealth: Payer: Self-pay | Admitting: Nurse Practitioner

## 2016-07-01 NOTE — Telephone Encounter (Addendum)
LVM on patient's personal machine. ----- Message from Volanda Napoleon, MD sent at 06/27/2016 12:37 PM EDT ----- Call - the labs and tumor level - CEA - are all normal!!  pete

## 2016-07-02 ENCOUNTER — Encounter (HOSPITAL_BASED_OUTPATIENT_CLINIC_OR_DEPARTMENT_OTHER): Payer: Self-pay

## 2016-07-02 ENCOUNTER — Ambulatory Visit (HOSPITAL_BASED_OUTPATIENT_CLINIC_OR_DEPARTMENT_OTHER)
Admission: RE | Admit: 2016-07-02 | Discharge: 2016-07-02 | Disposition: A | Discharge status: Home / Self Care | Payer: BLUE CROSS/BLUE SHIELD | Source: Ambulatory Visit | Attending: Hematology & Oncology | Admitting: Hematology & Oncology

## 2016-07-02 DIAGNOSIS — K561 Intussusception: Secondary | ICD-10-CM | POA: Insufficient documentation

## 2016-07-02 DIAGNOSIS — Z85038 Personal history of other malignant neoplasm of large intestine: Secondary | ICD-10-CM | POA: Diagnosis present

## 2016-07-02 DIAGNOSIS — K439 Ventral hernia without obstruction or gangrene: Secondary | ICD-10-CM | POA: Insufficient documentation

## 2016-07-02 DIAGNOSIS — R64 Cachexia: Secondary | ICD-10-CM | POA: Insufficient documentation

## 2016-07-02 DIAGNOSIS — K7689 Other specified diseases of liver: Secondary | ICD-10-CM | POA: Diagnosis not present

## 2016-07-02 MED ORDER — IOPAMIDOL (ISOVUE-300) INJECTION 61%
100.0000 mL | Freq: Once | INTRAVENOUS | Status: AC | PRN
  Administered 2016-07-02: 100 mL via INTRAVENOUS

## 2016-07-03 ENCOUNTER — Telehealth: Payer: Self-pay

## 2016-07-03 NOTE — Telephone Encounter (Signed)
-----   Message from Volanda Napoleon, MD sent at 07/03/2016  7:16 AM EDT ----- Call - the Ct scans show no obvious colon cancer!!!  pete

## 2016-08-15 ENCOUNTER — Telehealth: Payer: Self-pay

## 2016-08-15 ENCOUNTER — Other Ambulatory Visit: Payer: Self-pay | Admitting: Family Medicine

## 2016-08-15 MED ORDER — CHLORHEXIDINE GLUCONATE 0.12 % MT SOLN: 15.0000 mL | Freq: Two times a day (BID) | OROMUCOSAL | 1 refills | Status: AC

## 2016-08-15 MED ORDER — AMOXICILLIN 875 MG PO TABS: 875.0000 mg | ORAL_TABLET | Freq: Two times a day (BID) | ORAL | 0 refills | Status: DC

## 2016-08-15 MED ORDER — KETOPROFEN 50 MG PO CAPS: 50.0000 mg | ORAL_CAPSULE | Freq: Four times a day (QID) | ORAL | 0 refills | Status: DC | PRN

## 2016-08-15 NOTE — Progress Notes (Signed)
Abx, pain meds and a antimicrobial oral rinse was prescribed.  Tell him after he is done taking the oral antibiotics, I recommend he use the antimicrobial oral rinse to keep infection at [place] until he can see the dentist.    Please kindly let pt know that due to high risk of ABX resistance, I won't prescribe meds again for this and I STRONGLY recommend he does not take any antibiotics from any other physicians as well for this!   He needs to get to a dentist ASAP

## 2016-08-15 NOTE — Progress Notes (Signed)
Pt's SO informed.  She expressed understanding and is agreeable.  Charyl Bigger, CMA

## 2016-08-15 NOTE — Telephone Encounter (Signed)
Pt's SO called stating that pt is still having problems with an abscessed tooth.  They are requesting antibiotic until he can get in to see dentist.  He is awaiting for dentist to get on his insurance plan.  Also requesting a couple of days of pain medication until antibiotic kicks in.  Please advise.  Charyl Bigger, CMA

## 2016-09-27 ENCOUNTER — Other Ambulatory Visit (HOSPITAL_BASED_OUTPATIENT_CLINIC_OR_DEPARTMENT_OTHER): Payer: BLUE CROSS/BLUE SHIELD

## 2016-09-27 ENCOUNTER — Ambulatory Visit (HOSPITAL_BASED_OUTPATIENT_CLINIC_OR_DEPARTMENT_OTHER): Payer: BLUE CROSS/BLUE SHIELD | Admitting: Hematology & Oncology

## 2016-09-27 VITALS — BP 136/75 | HR 70 | Temp 97.8°F | Resp 20 | Wt 211.1 lb

## 2016-09-27 DIAGNOSIS — Z85038 Personal history of other malignant neoplasm of large intestine: Secondary | ICD-10-CM

## 2016-09-27 DIAGNOSIS — C184 Malignant neoplasm of transverse colon: Secondary | ICD-10-CM | POA: Diagnosis not present

## 2016-09-27 DIAGNOSIS — B1920 Unspecified viral hepatitis C without hepatic coma: Secondary | ICD-10-CM | POA: Diagnosis not present

## 2016-09-27 DIAGNOSIS — R64 Cachexia: Secondary | ICD-10-CM

## 2016-09-27 DIAGNOSIS — C187 Malignant neoplasm of sigmoid colon: Secondary | ICD-10-CM

## 2016-09-27 LAB — CBC WITH DIFFERENTIAL (CANCER CENTER ONLY)
BASO#: 0 10*3/uL (ref 0.0–0.2)
BASO%: 0.3 % (ref 0.0–2.0)
EOS%: 3.7 % (ref 0.0–7.0)
Eosinophils Absolute: 0.3 10*3/uL (ref 0.0–0.5)
HCT: 42.3 % (ref 38.7–49.9)
HGB: 14.7 g/dL (ref 13.0–17.1)
LYMPH#: 3 10*3/uL (ref 0.9–3.3)
LYMPH%: 33 % (ref 14.0–48.0)
MCH: 30.7 pg (ref 28.0–33.4)
MCHC: 34.8 g/dL (ref 32.0–35.9)
MCV: 88 fL (ref 82–98)
MONO#: 0.6 10*3/uL (ref 0.1–0.9)
MONO%: 6.8 % (ref 0.0–13.0)
NEUT#: 5.2 10*3/uL (ref 1.5–6.5)
NEUT%: 56.2 % (ref 40.0–80.0)
PLATELETS: 219 10*3/uL (ref 145–400)
RBC: 4.79 10*6/uL (ref 4.20–5.70)
RDW: 13 % (ref 11.1–15.7)
WBC: 9.2 10*3/uL (ref 4.0–10.0)

## 2016-09-27 LAB — COMPREHENSIVE METABOLIC PANEL (CC13)
A/G RATIO: 1.8 (ref 1.2–2.2)
ALT: 76 IU/L — AB (ref 0–44)
AST: 45 IU/L — AB (ref 0–40)
Albumin, Serum: 4.6 g/dL (ref 3.5–5.5)
Alkaline Phosphatase, S: 68 IU/L (ref 39–117)
BUN/Creatinine Ratio: 14 (ref 9–20)
BUN: 15 mg/dL (ref 6–24)
Bilirubin Total: 0.3 mg/dL (ref 0.0–1.2)
CALCIUM: 9.5 mg/dL (ref 8.7–10.2)
CO2: 25 mmol/L (ref 18–29)
Chloride, Ser: 100 mmol/L (ref 96–106)
Creatinine, Ser: 1.05 mg/dL (ref 0.76–1.27)
GFR calc Af Amer: 101 mL/min/{1.73_m2} (ref >59)
GFR, EST NON AFRICAN AMERICAN: 88 mL/min/{1.73_m2} (ref >59)
GLUCOSE: 94 mg/dL (ref 65–99)
Globulin, Total: 2.5 g/dL (ref 1.5–4.5)
POTASSIUM: 4 mmol/L (ref 3.5–5.2)
Sodium: 132 mmol/L — ABNORMAL LOW (ref 134–144)
Total Protein: 7.1 g/dL (ref 6.0–8.5)

## 2016-09-27 NOTE — Progress Notes (Signed)
Hematology and Oncology Follow Up Visit  Peter Boyd [id number] Oct 22, 1976 40 y.o. 09/27/2016   Principle Diagnosis:   Stage IIIB [id number]) adenocarcinoma of the transverse colon  Hepatitis C  Current Therapy:    Observation for the colon cancer.       Patient to start therapy for hepatitis C with [facility]       S/p FOLFOX x 12 cycles - completed in 08/2015    Interim History:  Peter Boyd is back for follow-up. We first saw him back in early August. We did go ahead and do some scans on him. The CT scans did not show any evidence of recurrent disease.  His CEA we first saw him was 1.8.  He will start therapy for the hepatitis C next week. He has the [place]. He will start this next week.  He is working. He's doing well with work. He has no fatigue. He has no weakness. He has no nausea or vomiting. He's had some diarrhea but this is more chronic.  He's had no leg swelling. He's had no weakness. He's had no rashes.  Overall, his performance status is ECOG 0.   Medications:  Current Outpatient Prescriptions:  .  Ascorbic Acid (VITAMIN C) 1000 MG tablet, Take 1,000 mg by mouth daily., Disp: , Rfl:  .  chlorhexidine (PERIDEX) 0.12 % solution, Use as directed 15 mLs in the mouth or throat 2 (two) times daily. Swish around for at least 30 seconds then spit it out, Disp: 473 mL, Rfl: 1 .  cyanocobalamin 1000 MCG tablet, Take 1,000 mcg by mouth daily., Disp: , Rfl:  .  Ledipasvir-Sofosbuvir 90-400 MG TABS, Take 90-400 mg by mouth daily., Disp: , Rfl:  .  Milk Thistle Extract 175 MG TABS, Take 175 mg by mouth daily., Disp: , Rfl:  .  Multiple Vitamin (MULTIVITAMIN) tablet, Take 1 tablet by mouth daily., Disp: , Rfl:   Allergies: No Known Allergies  Past Medical History, Surgical history, Social history, and Family History were reviewed and updated.  Review of Systems:  As above  Physical Exam:  weight is 211 lb 1.9 oz (95.8 kg). His oral temperature is 97.8 F (36.6 C). His blood  pressure is 136/75 and his pulse is 70. His respiration is 20.   Wt Readings from Last 3 Encounters:  09/27/16 211 lb 1.9 oz (95.8 kg)  06/26/16 211 lb (95.7 kg)  06/24/16 210 lb 12.8 oz (95.6 kg)     Head and neck exam shows no ocular or oral lesions. There are no palpable cervical or supraclavicular lymph nodes. Lungs are clear. Cardiac exam regular rate and rhythm with a normal S1 and S2. There are no murmurs, rubs or bruits. Abdomen is soft. He has good bowel sounds. There is no fluid wave. There is no palpable liver or spleen tip. Has a well-healed laparotomy scar. Back exam shows no tenderness over the spine, ribs or hips. Extremities shows no clubbing, cyanosis or edema. He has good range of motion of his joints. Skin exam shows no rashes, ecchymosis or petechia. Neurological exam shows no focal neurological deficits.   Lab Results  Component Value Date   WBC 9.2 09/27/2016   HGB 14.7 09/27/2016   HCT 42.3 09/27/2016   MCV 88 09/27/2016   PLT 219 09/27/2016     Chemistry      Component Value Date/Time   NA 134 06/26/2016 1511   K 4.2 06/26/2016 1511   CL 100 06/26/2016 1511   CO2 24  06/26/2016 1511   BUN 15 06/26/2016 1511   CREATININE 0.79 06/26/2016 1511   CREATININE 0.87 06/17/2016 0842      Component Value Date/Time   CALCIUM 9.5 06/26/2016 1511   ALKPHOS 70 06/26/2016 1511   AST 55 (H) 06/26/2016 1511   ALT 103 (H) 06/26/2016 1511   BILITOT 0.2 06/26/2016 1511         Impression and Plan: Peter Boyd is 40 year old white male with stage IIIB colon cancer. He had treatment with 12 cycles of FOLFOX. He is doing well. So far, the there's been no evidence of recurrent disease.  I'm glad of the hepatitis C will be treated. This will help him out quite a bit.  I that we have to follow up with another set of scans before then to the year. I of these next set of scans look good, then we can go every 6 months.  He is at significant risk for recurrence. Because of the  fact that his tumor was poorly differentiated. Tumor also had lymphovascular invasion. He also was partly obstructed.  He would definitely feel more confident having the CAT scan done before the end of the year.    Volanda Napoleon, MD 11/3/20174:20 PM

## 2016-09-30 ENCOUNTER — Telehealth: Payer: Self-pay

## 2016-09-30 LAB — CEA (IN HOUSE-CHCC): CEA (CHCC-In House): 2.27 ng/mL (ref 0.00–5.00)

## 2016-09-30 NOTE — Telephone Encounter (Signed)
Message left for pt to contact the office for lab results  CEA level is normal!!!pete   dph

## 2016-11-01 ENCOUNTER — Ambulatory Visit: Payer: BLUE CROSS/BLUE SHIELD | Admitting: Family Medicine

## 2016-11-20 ENCOUNTER — Ambulatory Visit (HOSPITAL_BASED_OUTPATIENT_CLINIC_OR_DEPARTMENT_OTHER)
Admission: RE | Admit: 2016-11-20 | Discharge: 2016-11-20 | Disposition: A | Discharge status: Home / Self Care | Payer: BLUE CROSS/BLUE SHIELD | Source: Ambulatory Visit | Attending: Hematology & Oncology | Admitting: Hematology & Oncology

## 2016-11-20 ENCOUNTER — Encounter (HOSPITAL_BASED_OUTPATIENT_CLINIC_OR_DEPARTMENT_OTHER): Payer: Self-pay

## 2016-11-20 ENCOUNTER — Other Ambulatory Visit (HOSPITAL_BASED_OUTPATIENT_CLINIC_OR_DEPARTMENT_OTHER): Payer: BLUE CROSS/BLUE SHIELD

## 2016-11-20 ENCOUNTER — Ambulatory Visit: Payer: BLUE CROSS/BLUE SHIELD | Admitting: Hematology & Oncology

## 2016-11-20 DIAGNOSIS — K769 Liver disease, unspecified: Secondary | ICD-10-CM | POA: Diagnosis not present

## 2016-11-20 DIAGNOSIS — Z9049 Acquired absence of other specified parts of digestive tract: Secondary | ICD-10-CM | POA: Diagnosis not present

## 2016-11-20 DIAGNOSIS — C779 Secondary and unspecified malignant neoplasm of lymph node, unspecified: Secondary | ICD-10-CM

## 2016-11-20 DIAGNOSIS — C184 Malignant neoplasm of transverse colon: Secondary | ICD-10-CM

## 2016-11-20 DIAGNOSIS — R918 Other nonspecific abnormal finding of lung field: Secondary | ICD-10-CM | POA: Insufficient documentation

## 2016-11-20 DIAGNOSIS — C187 Malignant neoplasm of sigmoid colon: Secondary | ICD-10-CM

## 2016-11-20 LAB — CBC WITH DIFFERENTIAL (CANCER CENTER ONLY)
BASO#: 0 10*3/uL (ref 0.0–0.2)
BASO%: 0.2 % (ref 0.0–2.0)
EOS ABS: 0.3 10*3/uL (ref 0.0–0.5)
EOS%: 3.4 % (ref 0.0–7.0)
HCT: 46.6 % (ref 38.7–49.9)
HEMOGLOBIN: 16.4 g/dL (ref 13.0–17.1)
LYMPH#: 3.1 10*3/uL (ref 0.9–3.3)
LYMPH%: 33.1 % (ref 14.0–48.0)
MCH: 30.6 pg (ref 28.0–33.4)
MCHC: 35.2 g/dL (ref 32.0–35.9)
MCV: 87 fL (ref 82–98)
MONO#: 0.6 10*3/uL (ref 0.1–0.9)
MONO%: 6 % (ref 0.0–13.0)
NEUT%: 57.3 % (ref 40.0–80.0)
NEUTROS ABS: 5.4 10*3/uL (ref 1.5–6.5)
PLATELETS: 211 10*3/uL (ref 145–400)
RBC: 5.36 10*6/uL (ref 4.20–5.70)
RDW: 12.9 % (ref 11.1–15.7)
WBC: 9.4 10*3/uL (ref 4.0–10.0)

## 2016-11-20 LAB — CMP (CANCER CENTER ONLY)
ALBUMIN: 4 g/dL (ref 3.3–5.5)
ALK PHOS: 58 U/L (ref 26–84)
ALT: 24 U/L (ref 10–47)
AST: 32 U/L (ref 11–38)
BILIRUBIN TOTAL: 0.8 mg/dL (ref 0.20–1.60)
BUN: 11 mg/dL (ref 7–22)
CO2: 27 mEq/L (ref 18–33)
CREATININE: 1.2 mg/dL (ref 0.6–1.2)
Calcium: 9.6 mg/dL (ref 8.0–10.3)
Chloride: 101 mEq/L (ref 98–108)
Glucose, Bld: 97 mg/dL (ref 73–118)
Potassium: 4 mEq/L (ref 3.3–4.7)
SODIUM: 142 meq/L (ref 128–145)
TOTAL PROTEIN: 7.8 g/dL (ref 6.4–8.1)

## 2016-11-20 LAB — CEA (IN HOUSE-CHCC): CEA (CHCC-IN HOUSE): 2.4 ng/mL (ref 0.00–5.00)

## 2016-11-20 LAB — LACTATE DEHYDROGENASE: LDH: 164 U/L (ref 125–245)

## 2016-11-20 MED ORDER — IOPAMIDOL (ISOVUE-300) INJECTION 61%
100.0000 mL | Freq: Once | INTRAVENOUS | Status: AC | PRN
  Administered 2016-11-20: 100 mL via INTRAVENOUS

## 2017-06-23 ENCOUNTER — Other Ambulatory Visit: Payer: Self-pay | Admitting: Hematology & Oncology

## 2017-06-23 DIAGNOSIS — C183 Malignant neoplasm of hepatic flexure: Secondary | ICD-10-CM

## 2017-07-09 ENCOUNTER — Ambulatory Visit: Payer: BLUE CROSS/BLUE SHIELD | Admitting: Hematology & Oncology

## 2017-07-09 ENCOUNTER — Other Ambulatory Visit: Payer: BLUE CROSS/BLUE SHIELD

## 2018-01-06 IMAGING — CT CT CHEST W/ CM
2 of 5 series · 13 of 36 positions shown, 16 images · IV contrast (APPLIED)
Comparison: CT 07/02/2016

CLINICAL DATA: Subsequent treatment strategy for colorectal
carcinoma.

EXAM:
CT CHEST, ABDOMEN, AND PELVIS WITH CONTRAST
TECHNIQUE: Multidetector CT imaging of the chest, abdomen and pelvis was
performed following the standard protocol during bolus
administration of intravenous contrast.
CONTRAST:  100mL [id number] IOPAMIDOL ([id number]) INJECTION 61%

[Series 2: cap with 2 · axial · 0.98mm/px · z∈[-747,-87]mm · 10 of 160 slices shown, 13 images]
[im 14/160  mediastinal]
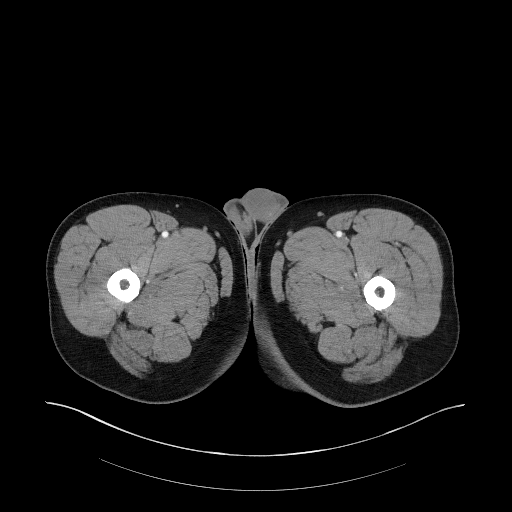
[im 14/160  lung]
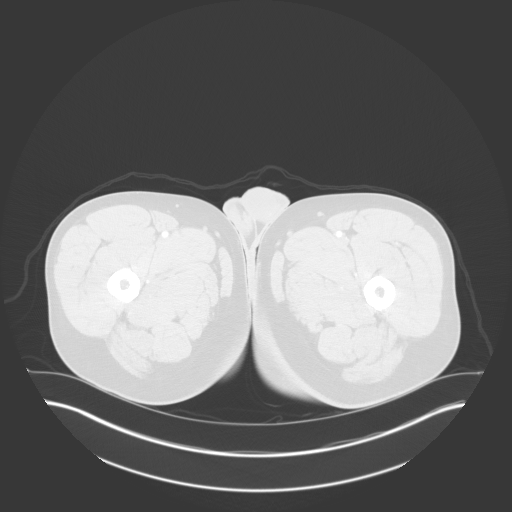
[im 27/160  lung]
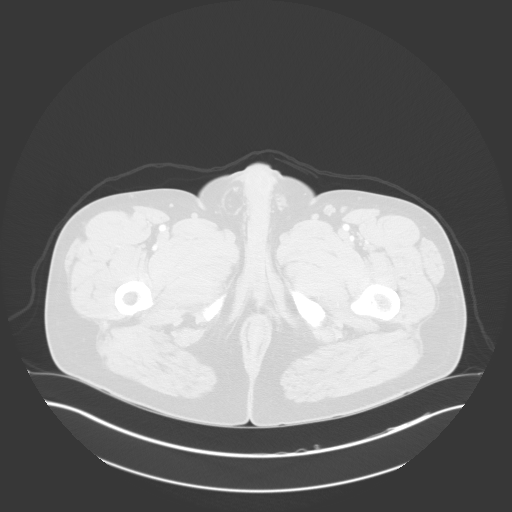
[im 40/160  lung]
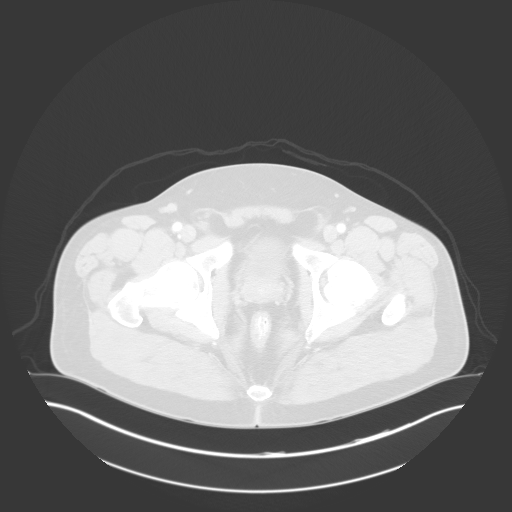
[im 54/160  lung]
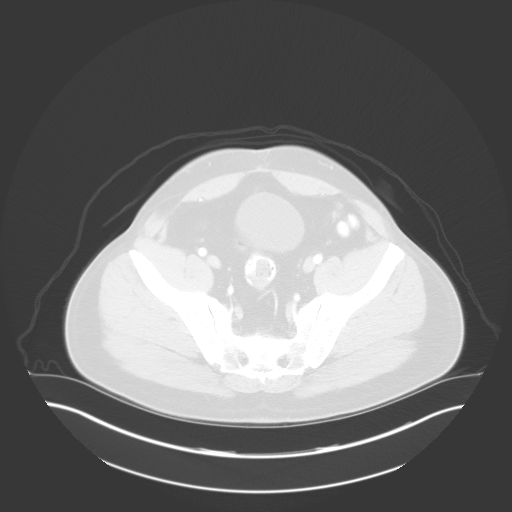
[im 67/160  mediastinal]
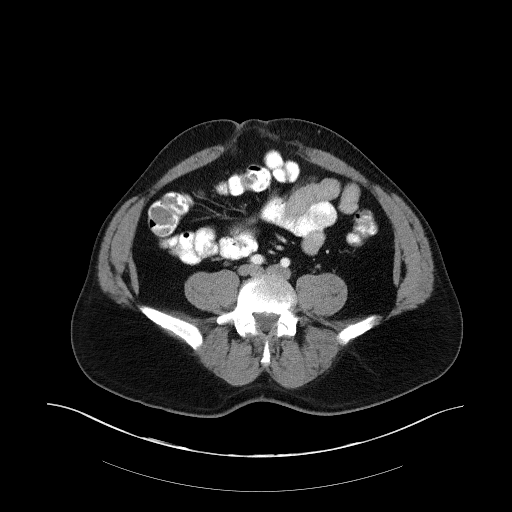
[im 67/160  lung]
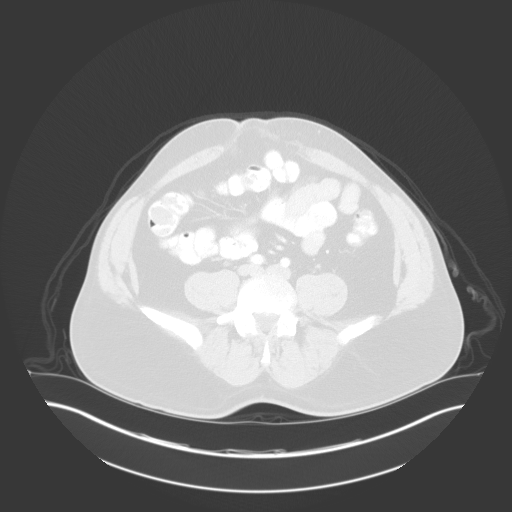
[im 93/160  lung]
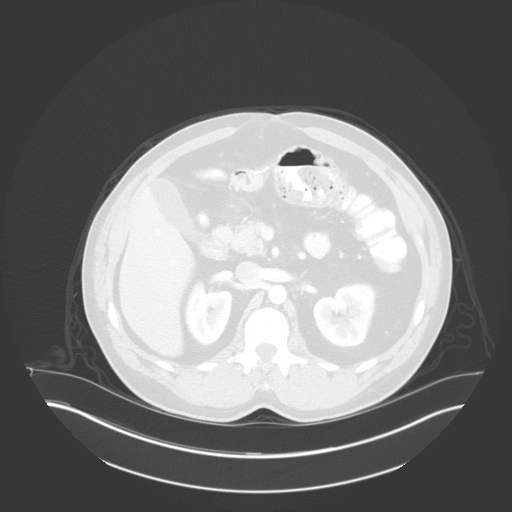
[im 107/160  lung]
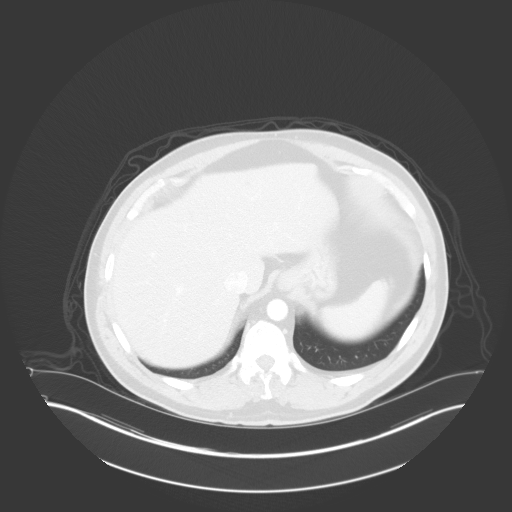
[im 120/160  lung]
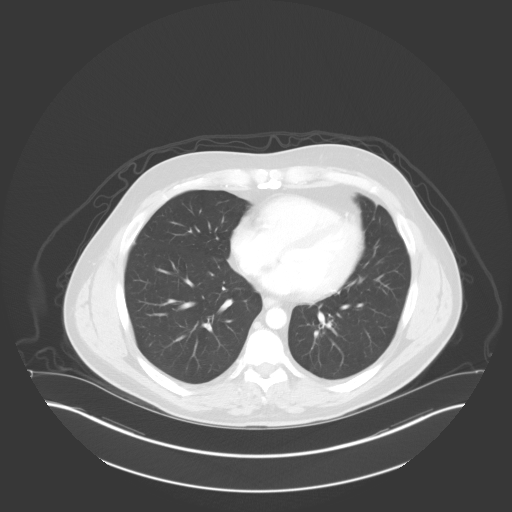
[im 133/160  mediastinal]
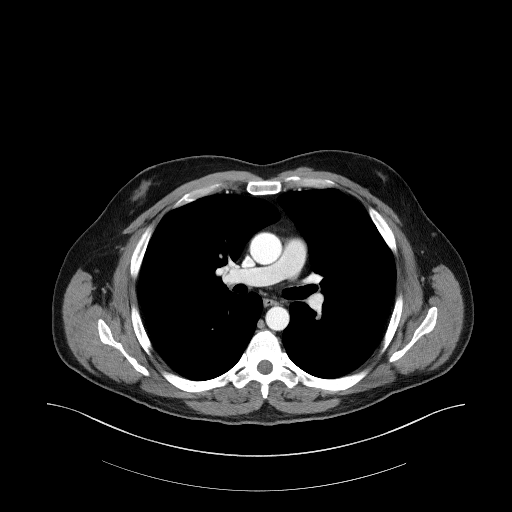
[im 133/160  lung]
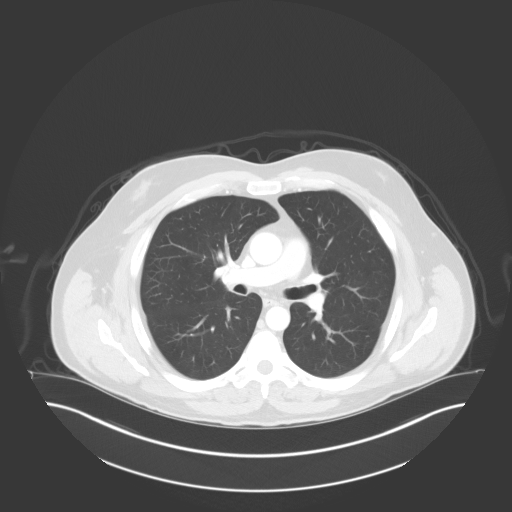
[im 146/160  lung]
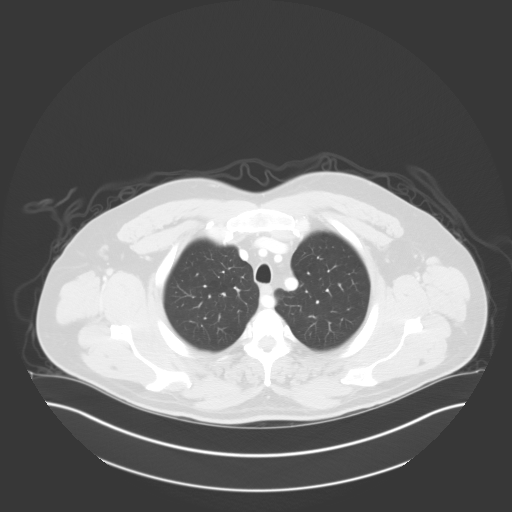

[Series 4: coronals · coronal · 0.92mm/px · 3 of 163 slices shown]
[im 33/163  lung]
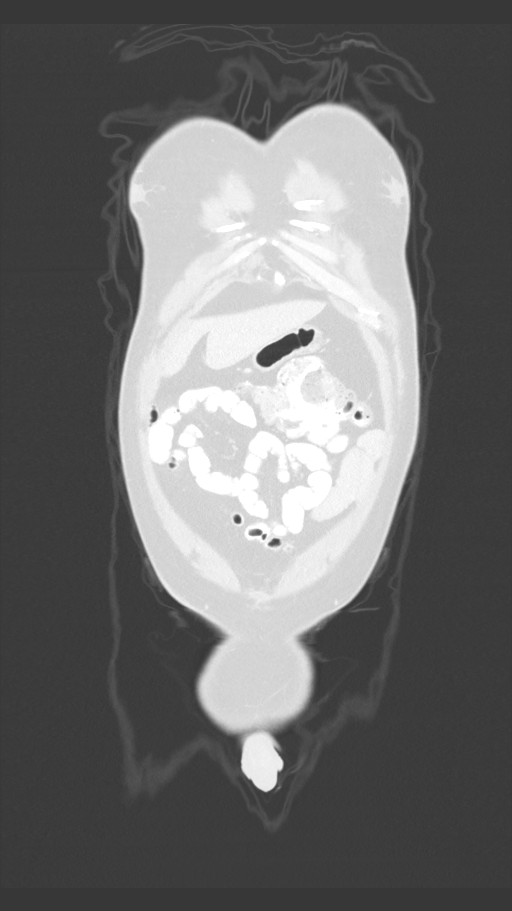
[im 65/163  lung]
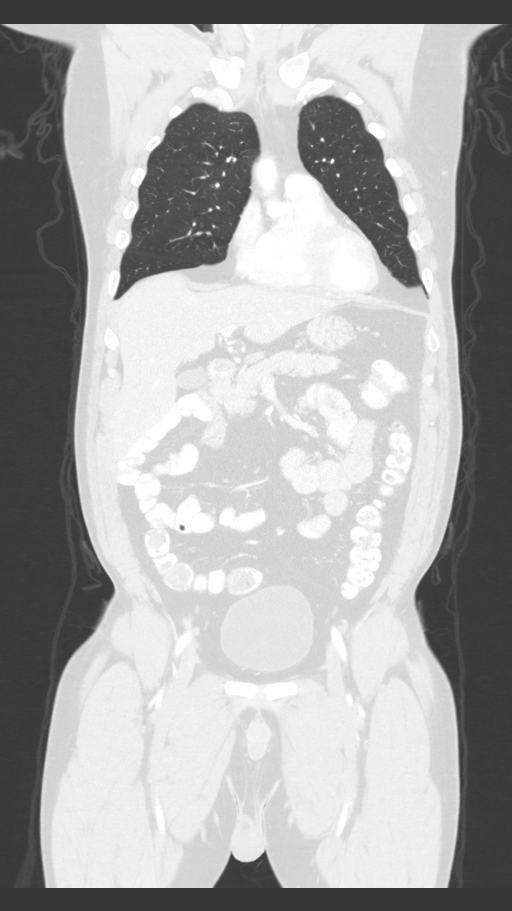
[im 98/163  lung]
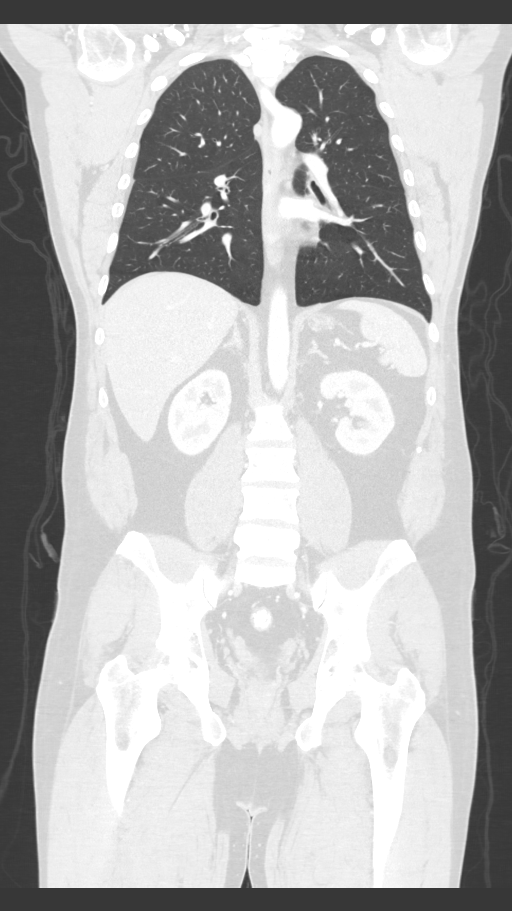

[13 of 36 positions shown; findings below may reference images not displayed]

FINDINGS: CT CHEST FINDINGS

Cardiovascular: No significant vascular findings. Normal heart size.
No pericardial effusion.

Mediastinum/Nodes: No axillary supraclavicular adenopathy. No
mediastinal hilar lymphadenopathy. No pericardial fluid. Esophagus
normal. Small node along the esophagus measuring 6 mm (image 49,
series 2) is unchanged.

Lungs/Pleura: Subpleural nodule RIGHT lower lobe measuring 3 mm (95,
series 3) is not changed.

Similar 2 mm nodule in the LEFT lower lobe on image 99, series 3 is
unchanged. No new pulmonary nodules.

Musculoskeletal: No aggressive osseous lesion.

CT ABDOMEN AND PELVIS FINDINGS

Hepatobiliary: Round low-density lesion in the dome the liver
measuring 11 mm (image 50, series 2) compares to 9 mm for no
significant change. Additional simple fluid -density lesions in
liver are also unchanged. No biliary duct dilatation.

Pancreas: Pancreas is normal. No ductal dilatation. No pancreatic
inflammation.

Spleen: Normal spleen

Adrenals/urinary tract: Adrenal glands and kidneys are normal. The
ureters and bladder normal.

Stomach/Bowel: Stomach, duodenum and small bowel normal. Post RIGHT
hemicolectomy anatomy. No nodularity at the anastomosis. Distal
colon is normal.

Vascular/Lymphatic: Abdominal aorta is normal caliber. There is no
retroperitoneal or periportal lymphadenopathy. No pelvic
lymphadenopathy. Small node in the gastrohepatic ligament is
unchanged measuring 8 mm (image 58, series 2).

Reproductive: Prostate normal

Other: No free fluid.  Fat filled RIGHT inguinal hernia.

Musculoskeletal: No aggressive osseous lesion.
IMPRESSION: Chest Impression:

1. Stable small subpleural pulmonary nodules.
2. No evidence of metastatic disease.

Abdomen / Pelvis Impression:

1. Stable low-density lesion in the dome of the RIGHT hepatic lobe.
2. No lymphadenopathy.
3. Post RIGHT hemicolectomy.
4. Stable exam.
